# Patient Record
Sex: Male | Born: 1980 | Smoking: Current every day smoker
Health system: Southern US, Community
[De-identification: ages and names within clinical notes are randomized; demographics above are authoritative.]

## PROBLEM LIST (undated history)

## (undated) HISTORY — PX: THROAT SURGERY: SHX803

---

## 2014-10-04 ENCOUNTER — Emergency Department (HOSPITAL_COMMUNITY): Payer: Self-pay

## 2014-10-04 ENCOUNTER — Encounter (HOSPITAL_COMMUNITY): Payer: Self-pay | Admitting: Emergency Medicine

## 2014-10-04 ENCOUNTER — Emergency Department (HOSPITAL_COMMUNITY)
Admission: EM | Admit: 2014-10-04 | Discharge: 2014-10-05 | Disposition: A | Discharge status: Home / Self Care | Payer: Self-pay | Attending: Emergency Medicine | Admitting: Emergency Medicine

## 2014-10-04 DIAGNOSIS — S41012A Laceration without foreign body of left shoulder, initial encounter: Secondary | ICD-10-CM | POA: Insufficient documentation

## 2014-10-04 DIAGNOSIS — Z23 Encounter for immunization: Secondary | ICD-10-CM | POA: Insufficient documentation

## 2014-10-04 DIAGNOSIS — T148XXA Other injury of unspecified body region, initial encounter: Secondary | ICD-10-CM

## 2014-10-04 DIAGNOSIS — R000 Tachycardia, unspecified: Secondary | ICD-10-CM | POA: Insufficient documentation

## 2014-10-04 LAB — I-STAT CHEM 8, ED
BUN: 17 mg/dL (ref 6–23)
CREATININE: 1.4 mg/dL — AB (ref 0.50–1.35)
Calcium, Ion: 1.01 mmol/L — ABNORMAL LOW (ref 1.12–1.23)
Chloride: 104 mEq/L (ref 96–112)
Glucose, Bld: 126 mg/dL — ABNORMAL HIGH (ref 70–99)
HCT: 45 % (ref 39.0–52.0)
Hemoglobin: 15.3 g/dL (ref 13.0–17.0)
POTASSIUM: 3.4 meq/L — AB (ref 3.7–5.3)
SODIUM: 137 meq/L (ref 137–147)
TCO2: 21 mmol/L (ref 0–100)

## 2014-10-04 LAB — COMPREHENSIVE METABOLIC PANEL
ALBUMIN: 3.9 g/dL (ref 3.5–5.2)
ALT: 23 U/L (ref 0–53)
AST: 21 U/L (ref 0–37)
Alkaline Phosphatase: 61 U/L (ref 39–117)
Anion gap: 14 (ref 5–15)
BUN: 17 mg/dL (ref 6–23)
CHLORIDE: 100 meq/L (ref 96–112)
CO2: 24 mEq/L (ref 19–32)
Calcium: 8.9 mg/dL (ref 8.4–10.5)
Creatinine, Ser: 1.38 mg/dL — ABNORMAL HIGH (ref 0.50–1.35)
GFR calc Af Amer: 77 mL/min — ABNORMAL LOW (ref >90)
GFR, EST NON AFRICAN AMERICAN: 66 mL/min — AB (ref >90)
Glucose, Bld: 125 mg/dL — ABNORMAL HIGH (ref 70–99)
POTASSIUM: 3.6 meq/L — AB (ref 3.7–5.3)
Sodium: 138 mEq/L (ref 137–147)
Total Bilirubin: 0.2 mg/dL — ABNORMAL LOW (ref 0.3–1.2)
Total Protein: 7.1 g/dL (ref 6.0–8.3)

## 2014-10-04 LAB — CBC
HEMATOCRIT: 42.5 % (ref 39.0–52.0)
Hemoglobin: 15 g/dL (ref 13.0–17.0)
MCH: 29.5 pg (ref 26.0–34.0)
MCHC: 35.3 g/dL (ref 30.0–36.0)
MCV: 83.7 fL (ref 78.0–100.0)
Platelets: 264 10*3/uL (ref 150–400)
RBC: 5.08 MIL/uL (ref 4.22–5.81)
RDW: 12.6 % (ref 11.5–15.5)
WBC: 14.7 10*3/uL — ABNORMAL HIGH (ref 4.0–10.5)

## 2014-10-04 LAB — TYPE AND SCREEN
ABO/RH(D): A POS
ANTIBODY SCREEN: NEGATIVE
UNIT DIVISION: 0
Unit division: 0

## 2014-10-04 LAB — ETHANOL: Alcohol, Ethyl (B): <11 mg/dL (ref 0–11)

## 2014-10-04 LAB — PROTIME-INR
INR: 1.07 (ref 0.00–1.49)
Prothrombin Time: 13.9 seconds (ref 11.6–15.2)

## 2014-10-04 LAB — ABO/RH: ABO/RH(D): A POS

## 2014-10-04 LAB — I-STAT CG4 LACTIC ACID, ED: Lactic Acid, Venous: 2.35 mmol/L — ABNORMAL HIGH (ref 0.5–2.2)

## 2014-10-04 LAB — CDS SEROLOGY

## 2014-10-04 MED ORDER — TETANUS-DIPHTH-ACELL PERTUSSIS 5-2.5-18.5 LF-MCG/0.5 IM SUSP
0.5000 mL | Freq: Once | INTRAMUSCULAR | Status: AC
Start: 2014-10-04 — End: 2014-10-04
  Administered 2014-10-04: 0.5 mL via INTRAMUSCULAR
  Filled 2014-10-04: qty 0.5

## 2014-10-04 MED ORDER — FENTANYL CITRATE 0.05 MG/ML IJ SOLN
50.0000 ug | Freq: Once | INTRAMUSCULAR | Status: AC
  Administered 2014-10-04: 50 ug via INTRAVENOUS
  Filled 2014-10-04: qty 2

## 2014-10-04 MED ORDER — SODIUM CHLORIDE 0.9 % IV BOLUS (SEPSIS)
1000.0000 mL | Freq: Once | INTRAVENOUS | Status: AC
  Administered 2014-10-04: 1000 mL via INTRAVENOUS

## 2014-10-04 MED ORDER — LIDOCAINE-EPINEPHRINE (PF) 2 %-1:200000 IJ SOLN
10.0000 mL | Freq: Once | INTRAMUSCULAR | Status: AC
  Administered 2014-10-04: 10 mL via INTRADERMAL
  Filled 2014-10-04: qty 20

## 2014-10-04 NOTE — Discharge Instructions (Signed)

## 2014-10-04 NOTE — ED Notes (Signed)
Dr. Lafayette Dragonarr at bedside to suture pt laceration.
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
## 2014-10-04 NOTE — ED Notes (Signed)
Laceration noted to left scapula, 1' long and .5" deep. Bleeding controlled.
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
## 2014-10-04 NOTE — ED Notes (Signed)
Corey LootsLockwood, MD is aware of the pt's bp.

## 2014-10-04 NOTE — ED Notes (Signed)
Pt on heart monitor with continuous monitoring q15 minutes. Pt axo x4. VSS

## 2014-10-05 ENCOUNTER — Encounter (HOSPITAL_COMMUNITY): Payer: Self-pay | Admitting: Emergency Medicine

## 2014-10-05 LAB — PREPARE FRESH FROZEN PLASMA
UNIT DIVISION: 0
Unit division: 0

## 2014-10-05 NOTE — ED Provider Notes (Signed)
  Physical Exam  BP 154/110  Pulse 105  Temp(Src) 98.1 F (36.7 C) (Oral)  Resp 12  Ht 6' (1.829 m)  Wt 265 lb (120.203 kg)  BMI 35.93 kg/m2  SpO2 100%  Physical Exam  ED Course  LACERATION REPAIR Date/Time: 10/05/2014 12:04 AM Performed by: Beverely RisenARR, Elynn Patteson Authorized by: Beverely RisenARR, Jezebelle Ledwell Consent: Verbal consent obtained. Body area: trunk Location details: back Laceration length: 3 cm Foreign bodies: no foreign bodies Tendon involvement: none Nerve involvement: none Vascular damage: yes Local anesthetic: lidocaine 2% with epinephrine Anesthetic total: 7 ml Preparation: Patient was prepped and draped in the usual sterile fashion. Irrigation solution: tap water Irrigation method: syringe Amount of cleaning: standard Debridement: none Degree of undermining: none Skin closure: 3-0 nylon and 4-0 nylon Number of sutures: 6 Technique: horizontal mattress and simple Approximation: close Approximation difficulty: complex Dressing: antibiotic ointment and 4x4 sterile gauze Patient tolerance: Patient tolerated the procedure well with no immediate complications.         Beverely Risenennis Evalene Vath, MD 10/05/14 0005

## 2014-10-05 NOTE — ED Provider Notes (Signed)
Please see my original note I was available throughout the procedure, and assisted / supervised key portions.  Gerhard Munchobert Aarik Blank, MD 10/05/14 [phone]
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
